# Patient Record
Sex: Female | Born: 1980 | Race: Black or African American | Hispanic: No | Marital: Single
Health system: Southern US, Community
[De-identification: ages and names within clinical notes are randomized; demographics above are authoritative.]

## PROBLEM LIST (undated history)

## (undated) DIAGNOSIS — G43909 Migraine, unspecified, not intractable, without status migrainosus: Secondary | ICD-10-CM

## (undated) HISTORY — DX: Migraine, unspecified, not intractable, without status migrainosus: G43.909

## (undated) HISTORY — PX: TUBAL LIGATION: SHX77

---

## 1998-03-22 ENCOUNTER — Ambulatory Visit (HOSPITAL_COMMUNITY): Admission: RE | Admit: 1998-03-22 | Discharge: 1998-03-22 | Payer: Self-pay | Admitting: *Deleted

## 1998-04-11 ENCOUNTER — Other Ambulatory Visit: Admission: RE | Admit: 1998-04-11 | Discharge: 1998-04-11 | Payer: Self-pay | Admitting: *Deleted

## 1998-05-24 ENCOUNTER — Ambulatory Visit (HOSPITAL_COMMUNITY): Admission: RE | Admit: 1998-05-24 | Discharge: 1998-05-24 | Payer: Self-pay | Admitting: *Deleted

## 1998-07-13 ENCOUNTER — Inpatient Hospital Stay (HOSPITAL_COMMUNITY): Admission: AD | Admit: 1998-07-13 | Discharge: 1998-07-13 | Payer: Self-pay | Admitting: *Deleted

## 1998-07-14 ENCOUNTER — Inpatient Hospital Stay (HOSPITAL_COMMUNITY): Admission: AD | Admit: 1998-07-14 | Discharge: 1998-07-16 | Payer: Self-pay | Admitting: *Deleted

## 1998-08-14 ENCOUNTER — Inpatient Hospital Stay (HOSPITAL_COMMUNITY): Admission: AD | Admit: 1998-08-14 | Discharge: 1998-08-14 | Payer: Self-pay | Admitting: Obstetrics

## 1998-10-05 ENCOUNTER — Emergency Department (HOSPITAL_COMMUNITY): Admission: EM | Admit: 1998-10-05 | Discharge: 1998-10-05 | Payer: Self-pay | Admitting: Emergency Medicine

## 1998-11-13 ENCOUNTER — Inpatient Hospital Stay (HOSPITAL_COMMUNITY): Admission: AD | Admit: 1998-11-13 | Discharge: 1998-11-13 | Payer: Self-pay | Admitting: Obstetrics

## 1999-07-25 ENCOUNTER — Inpatient Hospital Stay (HOSPITAL_COMMUNITY): Admission: AD | Admit: 1999-07-25 | Discharge: 1999-07-25 | Payer: Self-pay | Admitting: *Deleted

## 1999-10-17 ENCOUNTER — Inpatient Hospital Stay (HOSPITAL_COMMUNITY): Admission: AD | Admit: 1999-10-17 | Discharge: 1999-10-17 | Payer: Self-pay | Admitting: Obstetrics

## 2000-06-28 ENCOUNTER — Emergency Department (HOSPITAL_COMMUNITY): Admission: EM | Admit: 2000-06-28 | Discharge: 2000-06-28 | Payer: Self-pay | Admitting: Emergency Medicine

## 2002-02-09 ENCOUNTER — Emergency Department (HOSPITAL_COMMUNITY): Admission: EM | Admit: 2002-02-09 | Discharge: 2002-02-09 | Payer: Self-pay | Admitting: Emergency Medicine

## 2002-04-23 ENCOUNTER — Encounter: Payer: Self-pay | Admitting: Emergency Medicine

## 2002-04-23 ENCOUNTER — Emergency Department (HOSPITAL_COMMUNITY): Admission: EM | Admit: 2002-04-23 | Discharge: 2002-04-23 | Payer: Self-pay | Admitting: Emergency Medicine

## 2003-03-09 ENCOUNTER — Encounter: Payer: Self-pay | Admitting: Emergency Medicine

## 2003-03-09 ENCOUNTER — Emergency Department (HOSPITAL_COMMUNITY): Admission: EM | Admit: 2003-03-09 | Discharge: 2003-03-09 | Payer: Self-pay | Admitting: Emergency Medicine

## 2003-07-04 ENCOUNTER — Emergency Department (HOSPITAL_COMMUNITY): Admission: EM | Admit: 2003-07-04 | Discharge: 2003-07-04 | Payer: Self-pay | Admitting: Emergency Medicine

## 2004-03-21 ENCOUNTER — Emergency Department (HOSPITAL_COMMUNITY): Admission: EM | Admit: 2004-03-21 | Discharge: 2004-03-21 | Payer: Self-pay | Admitting: Emergency Medicine

## 2004-07-08 ENCOUNTER — Inpatient Hospital Stay (HOSPITAL_COMMUNITY): Admission: AD | Admit: 2004-07-08 | Discharge: 2004-07-08 | Payer: Self-pay | Admitting: Obstetrics

## 2004-08-11 ENCOUNTER — Inpatient Hospital Stay (HOSPITAL_COMMUNITY): Admission: AD | Admit: 2004-08-11 | Discharge: 2004-08-11 | Payer: Self-pay | Admitting: Obstetrics

## 2004-10-19 ENCOUNTER — Inpatient Hospital Stay (HOSPITAL_COMMUNITY): Admission: AD | Admit: 2004-10-19 | Discharge: 2004-10-22 | Payer: Self-pay | Admitting: Obstetrics

## 2004-10-21 ENCOUNTER — Encounter (INDEPENDENT_AMBULATORY_CARE_PROVIDER_SITE_OTHER): Payer: Self-pay | Admitting: *Deleted

## 2006-11-05 ENCOUNTER — Emergency Department (HOSPITAL_COMMUNITY): Admission: EM | Admit: 2006-11-05 | Discharge: 2006-11-05 | Payer: Self-pay | Admitting: Emergency Medicine

## 2007-01-16 ENCOUNTER — Emergency Department (HOSPITAL_COMMUNITY): Admission: EM | Admit: 2007-01-16 | Discharge: 2007-01-16 | Payer: Self-pay | Admitting: Emergency Medicine

## 2008-02-28 ENCOUNTER — Emergency Department (HOSPITAL_COMMUNITY): Admission: EM | Admit: 2008-02-28 | Discharge: 2008-02-28 | Payer: Self-pay | Admitting: Emergency Medicine

## 2008-04-23 ENCOUNTER — Emergency Department (HOSPITAL_COMMUNITY): Admission: EM | Admit: 2008-04-23 | Discharge: 2008-04-24 | Payer: Self-pay | Admitting: Physician Assistant

## 2008-06-27 ENCOUNTER — Emergency Department (HOSPITAL_COMMUNITY): Admission: EM | Admit: 2008-06-27 | Discharge: 2008-06-27 | Payer: Self-pay | Admitting: Emergency Medicine

## 2009-12-05 ENCOUNTER — Emergency Department (HOSPITAL_COMMUNITY): Admission: EM | Admit: 2009-12-05 | Discharge: 2009-12-05 | Payer: Self-pay | Admitting: Emergency Medicine

## 2010-03-06 ENCOUNTER — Ambulatory Visit (HOSPITAL_COMMUNITY): Admission: RE | Admit: 2010-03-06 | Discharge: 2010-03-06 | Payer: Self-pay | Admitting: Obstetrics

## 2011-01-22 LAB — CBC
HCT: 40.6 % (ref 36.0–46.0)
Platelets: 232 10*3/uL (ref 150–400)

## 2011-03-22 NOTE — Op Note (Signed)
[name], Amy Avery             ACCOUNT NO.:  [id number]   MEDICAL RECORD NO.:  [id number]          PATIENT TYPE:  INP   LOCATION:  9147                          FACILITY:  [facility]   PHYSICIAN:  Kathreen Cosier, M.D.DATE OF BIRTH:  1981/08/28   DATE OF PROCEDURE:  DATE OF DISCHARGE:                                 OPERATIVE REPORT   PREOPERATIVE DIAGNOSIS:  Multiparity.   POSTOPERATIVE DIAGNOSIS:  Multiparity.   PROCEDURE:  Postpartum tubal ligation.   DESCRIPTION OF PROCEDURE:  Using epidural with the patient in the supine  position, the abdomen was prepped and draped.  The bladder was emptied with  a straight catheter.   A subumbilical incision 1 inch long was made and carried down to the fascia.  The fascia was cleaned and grasped with two Kochers.  The fascia and  peritoneum were opened with the Mayo scissors.  The left tube was grasped in  the midportion with a Babcock clamp.  A 0 plain suture was placed in the  mesosalpinx below the portion of tube and the clamp.  This was tied, and  approximately 1 inch of tube was transected.  Hemostasis was satisfactory.  The procedure was done in a similar fashion on the other side.  The abdomen  was closed in layers, the peritoneum and fascia with continuous suture of 0  Dexon and the skin closed with subcuticular stitch of 4-0 Monocryl.  Estimated blood loss was less than 5 cc.   The patient tolerated the procedure well and was taken to the recovery room  in good condition.      BAM/MEDQ  D:  10/21/2004  T:  10/22/2004  Job:  [id number]

## 2011-03-22 NOTE — Discharge Summary (Signed)
NAMEKIT, BRUBACHER             ACCOUNT NO.:  [id number]   MEDICAL RECORD NO.:  [id number]          PATIENT TYPE:  INP   LOCATION:  9147                          FACILITY:  [facility]   PHYSICIAN:  Kathreen Cosier, M.D.DATE OF BIRTH:  [birth year]   DATE OF ADMISSION:  10/19/2004  DATE OF DISCHARGE:  10/22/2004                                 DISCHARGE SUMMARY   HOSPITAL COURSE:  The patient is a 30 year old gravida 6 para 2-0-3-2 with  [facility] November 02, 2004.  She was admitted in labor 4 cm, 60%, vertex, -2.  The patient had a normal vaginal delivery of a female, Apgars 9 and 9.  No  episiotomy or laceration.  She desired sterilization.  On admission, her  hemoglobin was 10.5, platelets 251; postdelivery 11.2 and 236.  RPR  negative.  Urinalysis negative.  On December 18 she underwent postpartum  tubal ligation.  She did well and was discharged home on postpartum day #2,  ambulatory, on a regular diet, on Tylox for pain, to see me in 6 weeks.   DISCHARGE DIAGNOSES:  Status post normal spontaneous vaginal delivery at  term and postpartum tubal ligation.      BAM/MEDQ  D:  10/22/2004  T:  10/22/2004  Job:  [id number]

## 2011-08-26 IMAGING — CR DG WRIST COMPLETE 3+V*L*
5 series · 5 of 5 positions shown · non-contrast
Comparison: None

CLINICAL DATA: Pain and swelling, no known injury

LEFT WRIST - COMPLETE 3+ VIEW

[x wrist pa left]
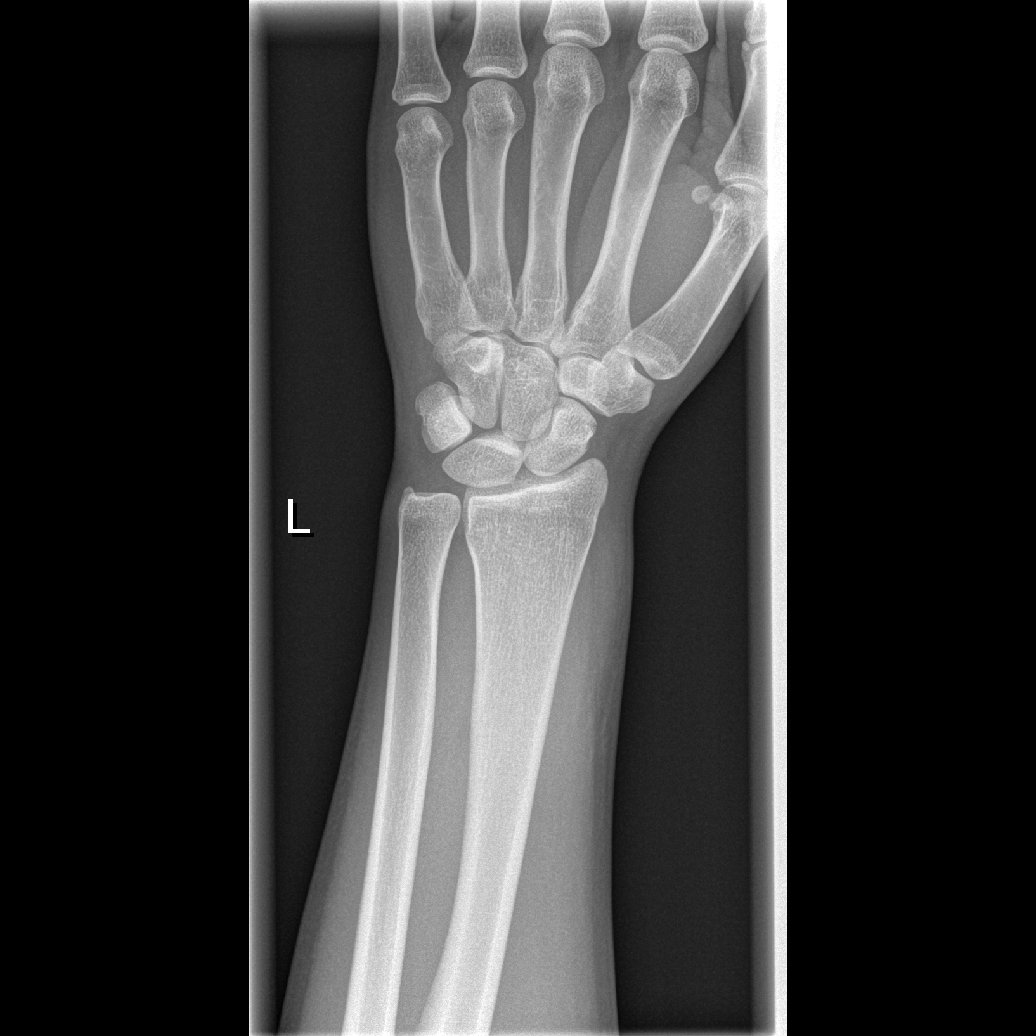

[x wrist obl left]
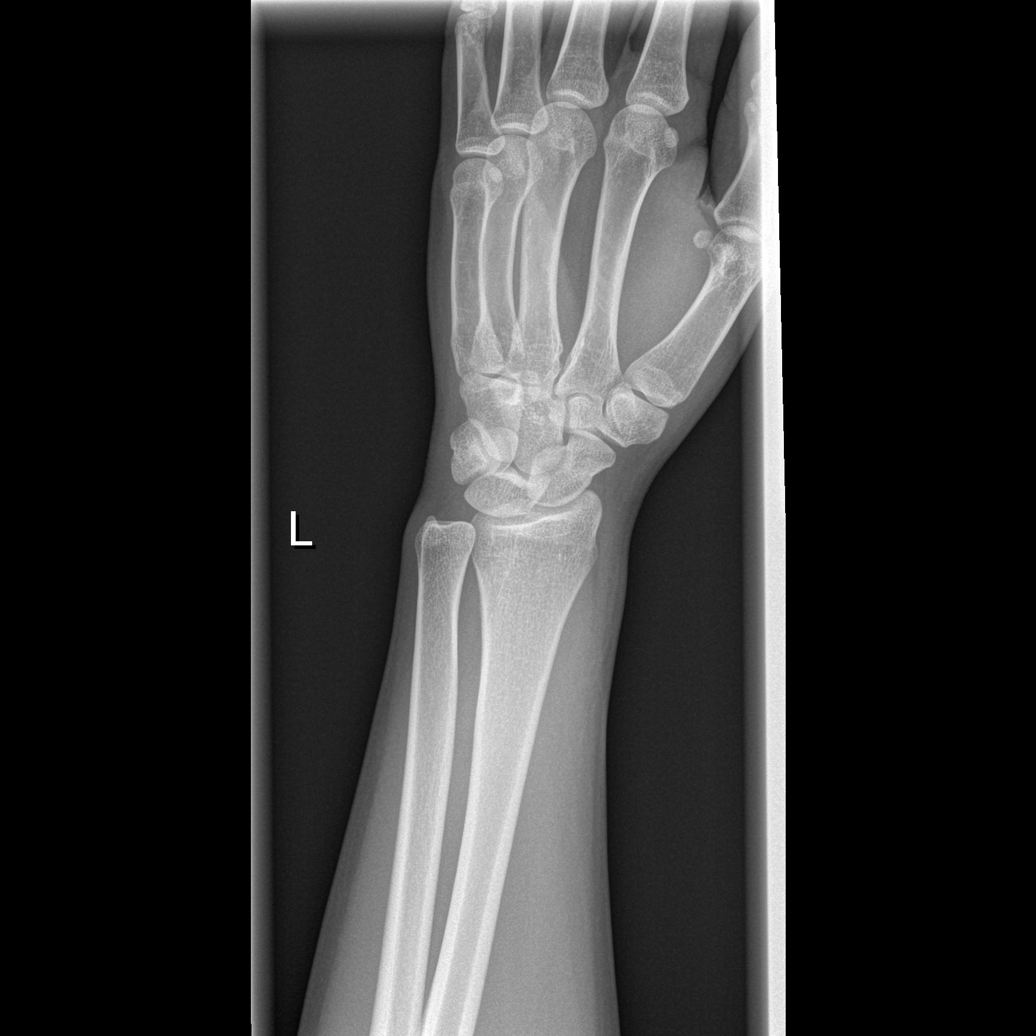

[x wrist lat left (1 of 2)]
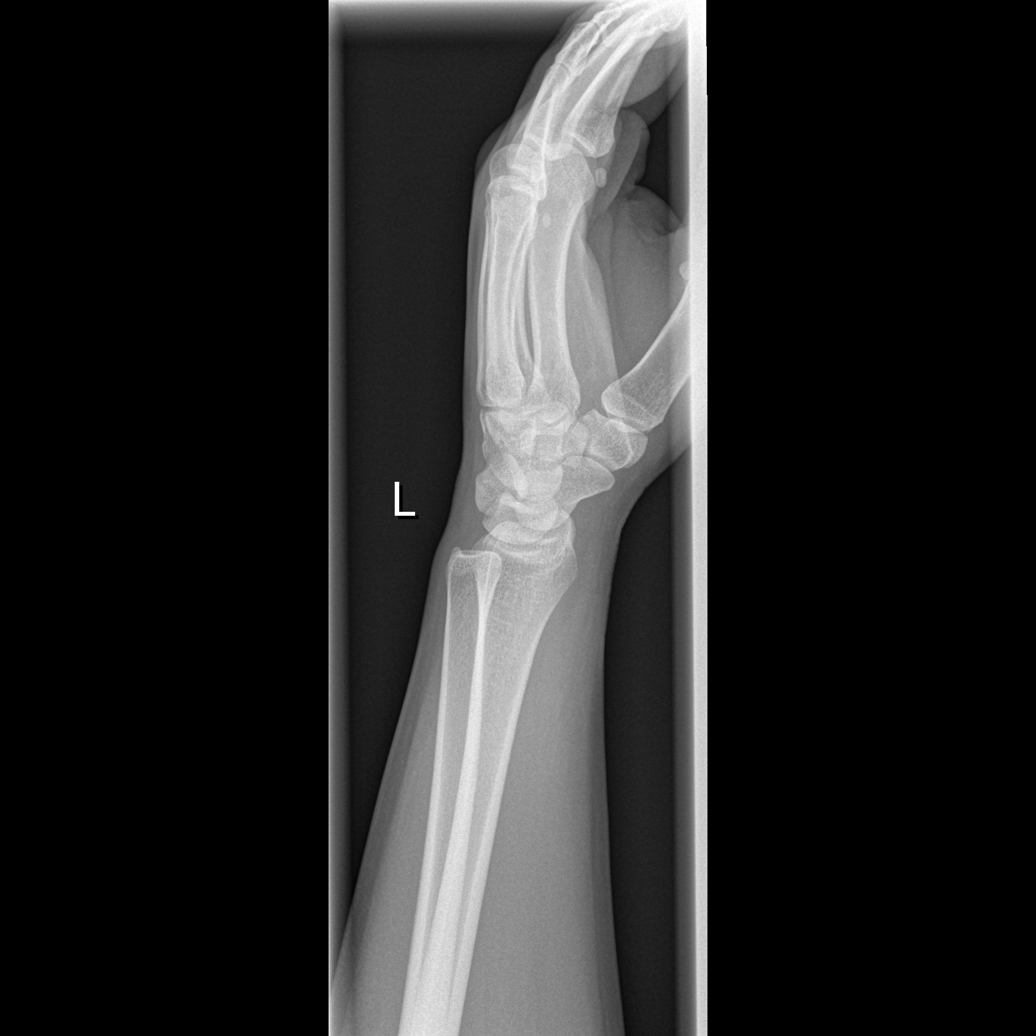

[x wrist lat left (2 of 2)]
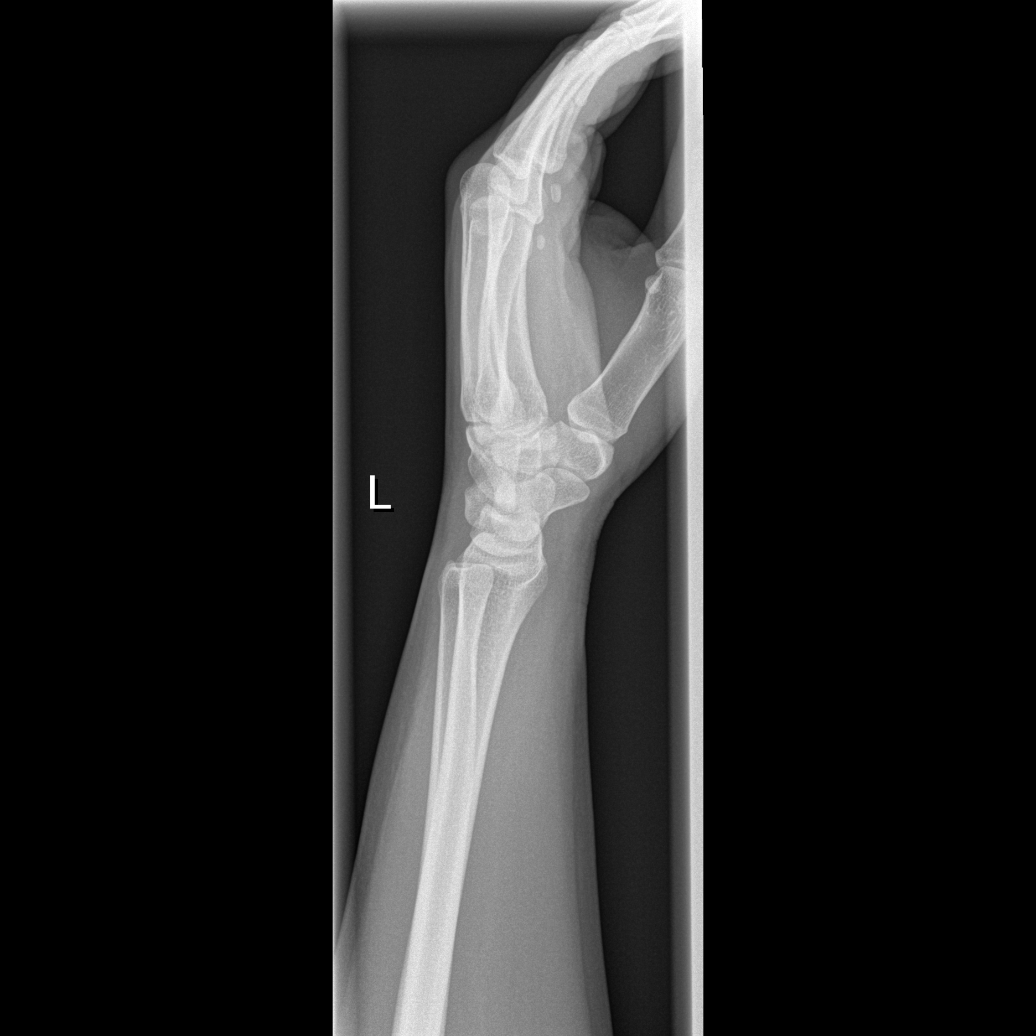

[x wrist navicular]
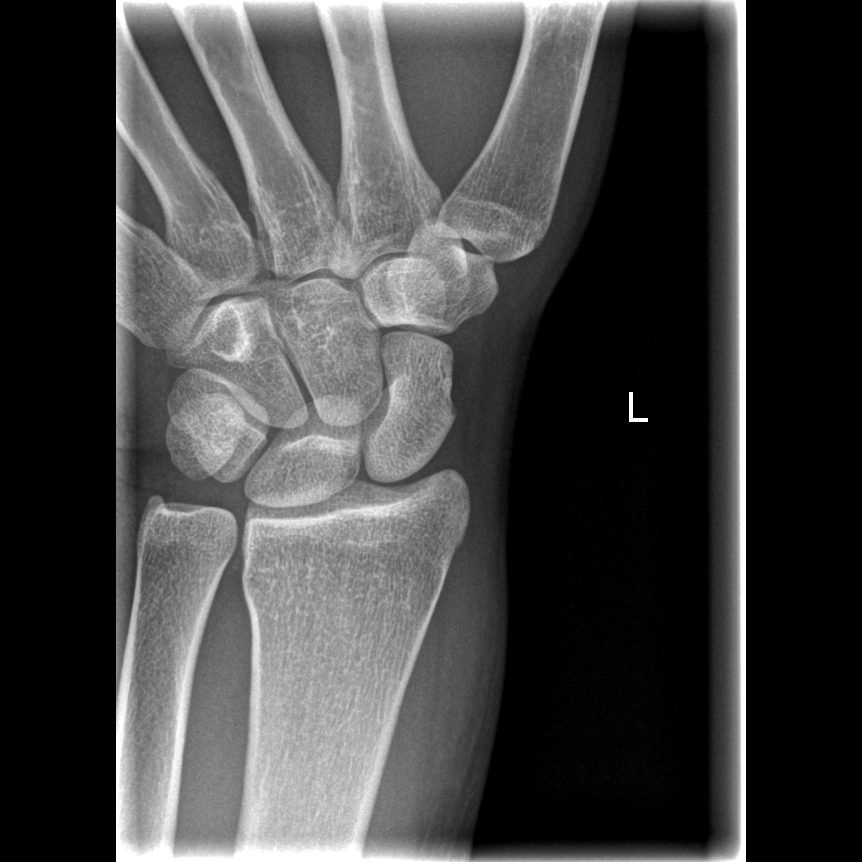

[5 of 5 positions shown; findings below may reference images not displayed]

FINDINGS: Bone mineralization normal.
Joint spaces preserved.
Soft tissue swelling radial aspect of distal left forearm.
No soft tissue gas or radiopaque foreign body.
No acute fracture, dislocation, or bone destruction.
IMPRESSION: Soft tissue swelling radial aspect of distal left forearm.
No acute bony abnormalities.

## 2013-07-22 ENCOUNTER — Ambulatory Visit: Payer: Medicaid Other | Admitting: Neurology

## 2013-07-22 ENCOUNTER — Encounter: Payer: Self-pay | Admitting: Neurology

## 2013-07-22 DIAGNOSIS — G43909 Migraine, unspecified, not intractable, without status migrainosus: Secondary | ICD-10-CM | POA: Insufficient documentation

## 2013-07-29 ENCOUNTER — Other Ambulatory Visit: Payer: Self-pay | Admitting: Neurosurgery

## 2013-07-29 DIAGNOSIS — M542 Cervicalgia: Secondary | ICD-10-CM

## 2014-07-04 ENCOUNTER — Other Ambulatory Visit (HOSPITAL_COMMUNITY): Payer: Self-pay | Admitting: Obstetrics

## 2014-07-04 DIAGNOSIS — Z1231 Encounter for screening mammogram for malignant neoplasm of breast: Secondary | ICD-10-CM

## 2014-07-14 ENCOUNTER — Ambulatory Visit (HOSPITAL_COMMUNITY)
Admission: RE | Admit: 2014-07-14 | Discharge: 2014-07-14 | Disposition: A | Discharge status: Home / Self Care | Payer: Medicaid Other | Source: Ambulatory Visit | Attending: Obstetrics | Admitting: Obstetrics

## 2014-07-14 DIAGNOSIS — Z1231 Encounter for screening mammogram for malignant neoplasm of breast: Secondary | ICD-10-CM | POA: Diagnosis present

## 2016-01-03 DEATH — deceased
